# Patient Record
Sex: Male | Born: 1970 | Race: Black or African American | Hispanic: No | Marital: Single
Health system: Southern US, Community
[De-identification: ages and names within clinical notes are randomized; demographics above are authoritative.]

---

## 2006-05-17 ENCOUNTER — Other Ambulatory Visit: Payer: Self-pay

## 2006-05-17 ENCOUNTER — Emergency Department: Payer: Self-pay | Admitting: Unknown Physician Specialty

## 2006-12-06 ENCOUNTER — Emergency Department: Payer: Self-pay | Admitting: Unknown Physician Specialty

## 2008-08-23 ENCOUNTER — Emergency Department: Payer: Self-pay | Admitting: Emergency Medicine

## 2008-11-17 ENCOUNTER — Emergency Department: Payer: Self-pay | Admitting: Emergency Medicine

## 2010-01-18 ENCOUNTER — Emergency Department: Payer: Self-pay | Admitting: Emergency Medicine

## 2013-03-29 ENCOUNTER — Emergency Department: Payer: Self-pay | Admitting: Emergency Medicine

## 2013-03-29 LAB — URINALYSIS, COMPLETE
Bilirubin,UR: NEGATIVE
Glucose,UR: NEGATIVE mg/dL (ref 0–75)
Ketone: NEGATIVE
Nitrite: NEGATIVE
RBC,UR: 242 /HPF (ref 0–5)
Specific Gravity: 1.023 (ref 1.003–1.030)
Squamous Epithelial: NONE SEEN
WBC UR: 691 /HPF (ref 0–5)

## 2013-03-29 LAB — RAPID INFLUENZA A&B ANTIGENS

## 2013-04-05 ENCOUNTER — Inpatient Hospital Stay: Payer: Self-pay | Admitting: Internal Medicine

## 2013-04-05 LAB — BASIC METABOLIC PANEL
BUN: 11 mg/dL (ref 7–18)
Calcium, Total: 9.1 mg/dL (ref 8.5–10.1)
Chloride: 105 mmol/L (ref 98–107)
Creatinine: 1.03 mg/dL (ref 0.60–1.30)
EGFR (Non-African Amer.): >60
Osmolality: 272 (ref 275–301)
Potassium: 4 mmol/L (ref 3.5–5.1)
Sodium: 137 mmol/L (ref 136–145)

## 2013-04-05 LAB — URINALYSIS, COMPLETE
Blood: NEGATIVE
Glucose,UR: NEGATIVE mg/dL (ref 0–75)
Hyaline Cast: 14
Ph: 5 (ref 4.5–8.0)
RBC,UR: 2 /HPF (ref 0–5)
Specific Gravity: 1.019 (ref 1.003–1.030)
Squamous Epithelial: 2

## 2013-04-05 LAB — CBC
HCT: 21.9 % — ABNORMAL LOW (ref 40.0–52.0)
HGB: 5.9 g/dL — ABNORMAL LOW (ref 13.0–18.0)
MCH: 16.9 pg — ABNORMAL LOW (ref 26.0–34.0)
MCHC: 26.8 g/dL — ABNORMAL LOW (ref 32.0–36.0)
MCV: 63 fL — ABNORMAL LOW (ref 80–100)
RBC: 3.47 10*6/uL — ABNORMAL LOW (ref 4.40–5.90)
RDW: 24.4 % — ABNORMAL HIGH (ref 11.5–14.5)
WBC: 18.7 10*3/uL — ABNORMAL HIGH (ref 3.8–10.6)

## 2013-04-05 LAB — HEPATIC FUNCTION PANEL A (ARMC)
Albumin: 3.3 g/dL — ABNORMAL LOW (ref 3.4–5.0)
SGOT(AST): 20 U/L (ref 15–37)
SGPT (ALT): 17 U/L (ref 12–78)

## 2013-04-05 LAB — RETICULOCYTES
Absolute Retic Count: 0.1069 10*6/uL (ref 0.019–0.186)
Reticulocyte: 3.29 % — ABNORMAL HIGH (ref 0.4–3.1)

## 2013-04-05 LAB — IRON AND TIBC
Iron Saturation: 2 %
Iron: 10 ug/dL — ABNORMAL LOW (ref 65–175)

## 2013-04-05 LAB — DIFFERENTIAL
Eosinophil #: 0 10*3/uL (ref 0.0–0.7)
Eosinophil %: 0.2 %
Lymphocyte #: 2.2 10*3/uL (ref 1.0–3.6)
Monocyte %: 5.4 %
Neutrophil #: 15.2 10*3/uL — ABNORMAL HIGH (ref 1.4–6.5)
Neutrophil %: 81.6 %

## 2013-04-05 LAB — RAPID HIV-1/2 QL/CONFIRM: HIV-1/2,Rapid Ql: NEGATIVE

## 2013-04-05 LAB — LACTATE DEHYDROGENASE: LDH: 162 U/L (ref 85–241)

## 2013-04-05 LAB — RAPID INFLUENZA A&B ANTIGENS

## 2013-04-06 LAB — COMPREHENSIVE METABOLIC PANEL
Albumin: 3 g/dL — ABNORMAL LOW (ref 3.4–5.0)
Alkaline Phosphatase: 70 U/L
Anion Gap: 5 — ABNORMAL LOW (ref 7–16)
BUN: 10 mg/dL (ref 7–18)
Calcium, Total: 8.6 mg/dL (ref 8.5–10.1)
Chloride: 109 mmol/L — ABNORMAL HIGH (ref 98–107)
Co2: 26 mmol/L (ref 21–32)
EGFR (African American): >60
EGFR (Non-African Amer.): >60
Osmolality: 278 (ref 275–301)
Total Protein: 7.5 g/dL (ref 6.4–8.2)

## 2013-04-06 LAB — CBC WITH DIFFERENTIAL/PLATELET
Basophil #: 0 10*3/uL (ref 0.0–0.1)
Basophil %: 0.3 %
Eosinophil %: 0.4 %
Lymphocyte #: 2.2 10*3/uL (ref 1.0–3.6)
Lymphocyte %: 17.7 %
MCH: 20.2 pg — ABNORMAL LOW (ref 26.0–34.0)
MCHC: 29.6 g/dL — ABNORMAL LOW (ref 32.0–36.0)
MCV: 68 fL — ABNORMAL LOW (ref 80–100)
Monocyte #: 1.1 x10 3/mm — ABNORMAL HIGH (ref 0.2–1.0)
Neutrophil #: 9.1 10*3/uL — ABNORMAL HIGH (ref 1.4–6.5)
Neutrophil %: 72.7 %
Platelet: 342 10*3/uL (ref 150–440)
RBC: 3.73 10*6/uL — ABNORMAL LOW (ref 4.40–5.90)

## 2013-04-06 LAB — FERRITIN: Ferritin (ARMC): 5 ng/mL — ABNORMAL LOW (ref 8–388)

## 2013-04-06 LAB — MAGNESIUM: Magnesium: 2.1 mg/dL

## 2013-04-06 LAB — TSH: Thyroid Stimulating Horm: 1.83 u[IU]/mL

## 2013-04-07 ENCOUNTER — Emergency Department: Payer: Self-pay | Admitting: Emergency Medicine

## 2013-04-07 LAB — CBC WITH DIFFERENTIAL/PLATELET
Basophil #: 0.1 10*3/uL (ref 0.0–0.1)
Basophil %: 0.6 %
Eosinophil #: 0.1 10*3/uL (ref 0.0–0.7)
Eosinophil #: 0.1 10*3/uL (ref 0.0–0.7)
Eosinophil %: 1 %
Eosinophil %: 1 %
HCT: 27.1 % — ABNORMAL LOW (ref 40.0–52.0)
HGB: 8 g/dL — ABNORMAL LOW (ref 13.0–18.0)
Lymphocyte #: 2.1 10*3/uL (ref 1.0–3.6)
Lymphocyte #: 2.4 10*3/uL (ref 1.0–3.6)
Lymphocyte %: 16.7 %
MCH: 20.4 pg — ABNORMAL LOW (ref 26.0–34.0)
MCHC: 30.2 g/dL — ABNORMAL LOW (ref 32.0–36.0)
MCHC: 29.7 g/dL — ABNORMAL LOW (ref 32.0–36.0)
Monocyte #: 1.1 x10 3/mm — ABNORMAL HIGH (ref 0.2–1.0)
Monocyte #: 1.3 x10 3/mm — ABNORMAL HIGH (ref 0.2–1.0)
Monocyte %: 8.3 %
Monocyte %: 9.4 %
Neutrophil #: 9.3 10*3/uL — ABNORMAL HIGH (ref 1.4–6.5)
Neutrophil #: 9.4 10*3/uL — ABNORMAL HIGH (ref 1.4–6.5)
Neutrophil %: 73.4 %
Neutrophil %: 70.7 %
Platelet: 303 10*3/uL (ref 150–440)
Platelet: 299 10*3/uL (ref 150–440)
RDW: 28.8 % — ABNORMAL HIGH (ref 11.5–14.5)
WBC: 12.7 10*3/uL — ABNORMAL HIGH (ref 3.8–10.6)

## 2013-04-07 LAB — COMPREHENSIVE METABOLIC PANEL
Albumin: 2.9 g/dL — ABNORMAL LOW (ref 3.4–5.0)
Alkaline Phosphatase: 69 U/L
Anion Gap: 4 — ABNORMAL LOW (ref 7–16)
Bilirubin,Total: 0.3 mg/dL (ref 0.2–1.0)
Calcium, Total: 9.3 mg/dL (ref 8.5–10.1)
Chloride: 107 mmol/L (ref 98–107)
Co2: 26 mmol/L (ref 21–32)
Creatinine: 0.76 mg/dL (ref 0.60–1.30)
EGFR (African American): >60
Glucose: 94 mg/dL (ref 65–99)
Osmolality: 271 (ref 275–301)
Potassium: 3.9 mmol/L (ref 3.5–5.1)
SGOT(AST): 24 U/L (ref 15–37)
SGPT (ALT): 16 U/L (ref 12–78)
Sodium: 137 mmol/L (ref 136–145)
Total Protein: 7.7 g/dL (ref 6.4–8.2)

## 2014-02-07 ENCOUNTER — Emergency Department: Payer: Self-pay | Admitting: Emergency Medicine

## 2014-02-07 LAB — COMPREHENSIVE METABOLIC PANEL
ALT: 21 U/L
ANION GAP: 2 — AB (ref 7–16)
Albumin: 4.1 g/dL (ref 3.4–5.0)
Alkaline Phosphatase: 88 U/L
BUN: 10 mg/dL (ref 7–18)
Bilirubin,Total: 0.3 mg/dL (ref 0.2–1.0)
CHLORIDE: 108 mmol/L — AB (ref 98–107)
CREATININE: 1.07 mg/dL (ref 0.60–1.30)
Calcium, Total: 9.3 mg/dL (ref 8.5–10.1)
Co2: 30 mmol/L (ref 21–32)
Glucose: 92 mg/dL (ref 65–99)
OSMOLALITY: 278 (ref 275–301)
Potassium: 4.1 mmol/L (ref 3.5–5.1)
SGOT(AST): 19 U/L (ref 15–37)
Sodium: 140 mmol/L (ref 136–145)
Total Protein: 8.1 g/dL (ref 6.4–8.2)

## 2014-02-07 LAB — CBC WITH DIFFERENTIAL/PLATELET
BASOS ABS: 0 10*3/uL (ref 0.0–0.1)
BASOS PCT: 0.8 %
Eosinophil #: 0.2 10*3/uL (ref 0.0–0.7)
Eosinophil %: 4.6 %
HCT: 48.4 % (ref 40.0–52.0)
HGB: 15.4 g/dL (ref 13.0–18.0)
Lymphocyte #: 1.8 10*3/uL (ref 1.0–3.6)
Lymphocyte %: 42.4 %
MCH: 29.1 pg (ref 26.0–34.0)
MCHC: 31.7 g/dL — ABNORMAL LOW (ref 32.0–36.0)
MCV: 92 fL (ref 80–100)
MONO ABS: 0.5 x10 3/mm (ref 0.2–1.0)
Monocyte %: 10.9 %
Neutrophil #: 1.8 10*3/uL (ref 1.4–6.5)
Neutrophil %: 41.3 %
Platelet: 134 10*3/uL — ABNORMAL LOW (ref 150–440)
RBC: 5.28 10*6/uL (ref 4.40–5.90)
RDW: 14.9 % — AB (ref 11.5–14.5)
WBC: 4.3 10*3/uL (ref 3.8–10.6)

## 2014-08-04 NOTE — Consult Note (Signed)
Brief Consult Note: Diagnosis: Anemia.  Year long history of rectal bleeding. Syncopal episode.   Consult note dictated.   Comments: Patient's presentation discussed with Dr. Lutricia FeilPaul Oh.  Recommendation is for continued serial monitoring of hemoglobin.  Transfuse as necessary.  Will place patient on clear liquid diet for tomorrow. Dr. Shelle Ironein will be covering tomorrow.  Will allow Dr. Shelle Ironein to make the decision about proceeding wtih diagnostic colonoscopy on Friday of this week for the indications of IDA and rectal bleeding.  Family history of colon cancer involving second degree relative.  Procedure, risks versus benefits discussed with patient.  Patient voiced understanding and willing to proceed with luminal evaluation inpatient or outpatient.  Electronic Signatures: Rodman KeyHarrison, Lavida Patch S (NP)  (Signed 24-Dec-14 10:37)  Authored: Brief Consult Note   Last Updated: 24-Dec-14 10:37 by Rodman KeyHarrison, Kelven Flater S (NP)

## 2014-08-04 NOTE — Consult Note (Signed)
Pt seen and examined. Please see Dawn Harrison's notes. Pt with chronic hx of hematochezia. Family hx of colon cancer/diverticulosis. Pt had reg diet today. Feeling much better. Clear liquid diet ordered for tomorow. Dr. Shelle Ironein will determine whether to proceed with inpt colonoscopy on Fri and do as outpt if patient wants to be home for Christmas. Thanks.  Electronic Signatures: Lutricia Feilh, Amaryllis Malmquist (MD)  (Signed on 24-Dec-14 11:04)  Authored  Last Updated: 24-Dec-14 11:04 by Lutricia Feilh, Kammy Klett (MD)

## 2014-08-04 NOTE — Discharge Summary (Signed)
PATIENT NAME:  Jared Arnold, Jared Arnold MR#:  [id number] DATE OF BIRTH:  Aug 02, 1970  DATE OF ADMISSION:  04/05/2013 DATE OF DISCHARGE:  04/07/2013  DISCHARGE DIAGNOSES: 1.  Symptomatic iron deficiency anemia, now hemodynamically stable, status post 2 units of packed red blood cell transfusion on iron replacement. Will require outpatient gastroenterology work-up.  2.  Orthostatic hypotension now resolved, likely due to anemia.  3.  Urinary tract infection treated, urine culture being negative. Will stop antibiotics.   SECONDARY DIAGNOSIS: None.   CONSULTATIONS: GI, Dr. Lutricia FeilPaul Oh.   PROCEDURES AND RADIOLOGY: 1.  Chest x-ray on the 23rd of December showed no acute cardiopulmonary disease.  2.  UA on admission was showing trace bacteria, 10 WBCs, 2+ leukocyte esterase.  3.  Urine culture was negative. Blood cultures x 2 were negative on admission.  4.  Influenza A and B were negative. 5.  Serum haptoglobin was elevated with a value of 209.  6.  Rapid HIV antibodies were negative.   HISTORY AND SHORT HOSPITAL COURSE: The patient is a 44 year old male with no significant medical problem was admitted for syncopal episode thought to be secondary to orthostatic hypotension from severe symptomatic anemia, likely due to iron deficiency. Please see Dr. Larose HiresVachhani's dictated history and physical for further details. The patient was started on iron therapy as his iron was significantly low with a value of 10. He was feeling much better. He was given 2 units of packed red blood cells for transfusion as his hemoglobin on admission was 5.9. The patient's hemoglobin responded well to 2 units of blood and has been gradually improving. His hemoglobin is 8 today and GI consult has been obtained with Dr. Bluford Kaufmannh who recommended outpatient GI evaluation, as the patient remained hemodynamically stable. After discussion with the patient, he was agreeable with the same and is being discharged home in stable condition.     On the  date of discharge, his vital signs are as follows: Temperature 98.4, heart rate 78 per minute, respirations 18 per minute, blood pressure 116/68 mmHg. He was saturating 99% on room air.   PERTINENT PHYSICAL EXAMINATION:  ON the date of discharge:  CARDIOVASCULAR: S1, S2 normal. No murmurs, rubs or gallop.  LUNGS: Clear to auscultation bilaterally. No wheezing, rales, rhonchi or crepitation.  ABDOMEN: Soft, benign.  NEUROLOGIC: Nonfocal examination. All other physical examination remained at baseline.   DISCHARGE MEDICATIONS:  Iron sulfate 325 mg p.o. 3 times a day.   DISCHARGE DIET: Regular.   DISCHARGE ACTIVITY: As tolerated.  DISCHARGE INSTRUCTIONS AND FOLLOWUP:  The patient was instructed to follow up with a new primary care physician at [facility] in 1 to 2 weeks. [facility] to 4 weeks.  TOTAL TIME DISCHARGING THIS PATIENT:  55 minutes.   ____________________________ Ellamae SiaVipul S. Sherryll BurgerShah, MD vss:ce D: 04/07/2013 14:22:00 ET Arnold: 04/07/2013 19:06:55 ET JOB#: [id number]  cc: Tarance Balan S. Sherryll BurgerShah, MD, <Dictator> [facility] Ezzard StandingPaul Y. Bluford Kaufmannh, MD Patricia PesaVIPUL S Simona Rocque MD ELECTRONICALLY SIGNED 04/10/2013 10:27

## 2014-08-05 NOTE — Consult Note (Signed)
PATIENT NAME:  Jared Arnold, Jared Arnold DATE OF BIRTH:  [birth year]  DATE OF ADMISSION:  04/05/2013 DATE OF CONSULTATION:  04/06/2013  CONSULTING PHYSICIAN:  Jared Keyawn S. Harsimran Westman, NP and Jared Arnold.   Jared Arnold is a 44 year old African American gentleman who has unremarkable past medical history.  He does state that about 3 to 4 weeks ago he was diagnosed with the flu, and then last week he presented to the Emergency Room for the complaint of hematuria and dysuria. He was diagnosed with a urinary tract infection and was placed on ciprofloxacin. States that he is supposed to finish the antibiotic regimen today. The patient does not complain of hematuria or dysuria at this point. 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 He states he has not been doing very much work, thus does not really complain of excessive fatigue or weakness.  Yesterday, he was getting ready to go out with his cousin and sustained a syncopal episode. He lost consciousness for about 1 to 2 seconds. His cousin helped him up and then brought him to the Emergency Room where he was found to have a hemoglobin of 5.9 and admitted for a syncopal episode and severe anemia. He is also found to have orthostatic hypotension.   He denies any nausea or vomiting. No abdominal pain. Weight has been stable. Bowels have been on average every other day for the past year, although he has noted evidence of rectal bleeding, frank bright red blood noted mixed in stool, water, as well as on toilet paper. States that he has had 1 occurrence on average of rectal bleeding. No melena. The patient denies ever having any colonoscopy or an upper endoscopy performed in the past. No history of chest pain, shortness of breath or dizziness. The patient states that he had been feeling well.   PAST MEDICAL HISTORY: Unremarkable.   PAST SURGICAL HISTORY: Unremarkable.   SOCIAL HISTORY: Resides with his mother.  Smokes a pack of cigarettes a day. No alcohol for the past month. Prior use was a sixpack a week. Marijuana use on a regular basis. The last occurrence was yesterday. Denies any other history of recreational drug use. Currently unemployed.   FAMILY HISTORY: Mother with history of diabetes, lung cancer involving grandparents, maternal uncle history of colon cancer, diagnosed in his 850s. No family history of colonic polyps, IBD or celiac disease.   MEDICATIONS:  Ciprofloxacin as prescribed for urinary tract infection.   ALLERGIES: None.   REVIEW OF SYSTEMS: CONSTITUTIONAL: No fevers. Mild fatigue, weakness, but again, denies excessive.  EYES: No blurred vision, double vision.  EARS, NOSE AND THROAT: No tinnitus, ear pain, hearing loss.  RESPIRATORY: No coughing. No wheezing.  CARDIOVASCULAR: No chest pain, heart palpitations, syncopal episodes.  GASTROINTESTINAL: See HPI.  GENITOURINARY: See HPI.  ENDOCRINE: No heat or cold intolerance.  SKIN: No rashes. No lesions.  MUSCULOSKELETAL: Denies arthralgias or myalgias.  NEUROLOGIC: Denies history of CVA, seizure disorder or TIA.  PSYCHIATRIC: No history of depression or anxiety.   PHYSICAL EXAMINATION: VITAL SIGNS: Temperature 98.4. Respirations are 18. Blood pressure is 105/66 with a pulse of 75. O2 sat is 99%.  GENERAL: Well-developed, well-nourished 44 year old African American gentleman, no acute distress noted. Pleasant. Resting comfortably in bed.  HEENT: Normocephalic, atraumatic. Pupils equal and reactive to light. Conjunctivae clear. Sclerae anicteric.  NECK: Supple. Trachea midline. No lymphadenopathy or thyromegaly.  PULMONARY: Symmetric rise and fall of chest. Clear to  auscultation throughout.  CARDIOVASCULAR: Regular rhythm, S1, S2. No murmurs. No gallops.  ABDOMEN: Soft, nondistended. Bowel sounds in 4 quadrants. No bruits. No masses. No evidence of hepatosplenomegaly.  RECTAL:  Numerous external hemorrhoids noted,  non-thrombosed. No evidence of active bleeding. Digital examination: Tenderness in excess probably related to hemorrhoids. Yellow-brownish-colored stool. Heme negative.  MUSCULOSKELETAL: Moves all 4 extremities. No contractures. No clubbing.  EXTREMITIES: No edema.  PSYCHIATRIC: Alert and oriented x 4. Memory grossly intact. Appropriate affect and mood.   LABORATORY, DIAGNOSTIC AND RADIOLOGICAL DATA: Chemistry panel on admission within normal except serum Arnold was low at 10.  BUN was 11. Anion gap is 6. TIBC is elevated at 490. Comparison to today's date, chemistry panel remains within normal limits except chloride is elevated at 109. Anion gap is still low at 5. Ferritin level is 5. Magnesium 2.1, calcium 8.6. Hepatic panel: Albumin was low at 3.3 on admission and currently is 3.0 today, but AST, ALT all remain within normal limits. Troponin is less than 0.02. CBC: WBC count elevated at 18.7, RBC of 3.47, hemoglobin 5.9, hematocrit 21.9, platelet count 473,000, MCV low at 63 with an MCH of 16.9 and MCHC of 26.8, RDW 24.1. WBC count still remains elevated, but improved at 12.5. Hemoglobin after 2 units of packed red blood cells being transfused, transfused hemoglobin is 7.5 with hematocrit of 25.3. Retic count is elevated at 3.29. Absolute retic count is 0.1069. Blood cultures x 2: No growth 8 to 12 hours. Influenza swab is negative.  Urinalysis: +2 leukocytes. Blood is negative. Haptoglobin is elevated at 209. HIV is negative. Chest, PA and lateral, no acute cardiopulmonary disease.   IMPRESSION: Anemia, year-long history of rectal bleeding intermittently, syncopal episode.   PLAN: The patient's presentation was discussed with Dr. Lutricia Feil.  Recommendations are to continue serial monitoring of hemoglobin at this time, transfuse as necessary. We will place the patient on clear liquid diet tomorrow. Jared Arnold is covering tomorrow. We will allow Jared Arnold to make the decision about proceed with diagnostic  colonoscopy on Friday of this week for the indication of IDA and rectal bleeding. Family history of colon cancer involved in second-degree relative. Procedure risks versus benefits was discussed with the patient. The patient voiced understanding and willing to proceed with luminal evaluation, inpatient or outpatient, depending on decision which is made.   These services provided by Jared Key, MS, APRN, [facility], FNP, under collaborative agreement with Dr. Lutricia Feil.     ____________________________ Jared Key, NP dsh:dmm D: 04/06/2013 10:47:00 ET T: 04/06/2013 10:58:05 ET JOB#: [id number]  cc: Jared Key, NP, <Dictator> Jared Key MD ELECTRONICALLY SIGNED 04/18/2013 8:11

## 2014-08-05 NOTE — H&P (Signed)
PATIENT NAME:  FADEL, CLASON MR#:  [id number] DATE OF BIRTH:  1970-05-02  DATE OF ADMISSION:  04/05/2013  PRIMARY CARE PHYSICIAN: None.   REFERRING EMERGENCY ROOM PHYSICIAN:  Lurena Joiner L. Lord, MD  CHIEF COMPLAINT: Syncopal episode.   HISTORY OF PRESENT ILLNESS: A 44 year old male with not known past medical history, not going to any doctor. Last week, he had complaint of noticing some blood in his urine and had burning in the urine so came to the Emergency Room, found having positive UA and was given ciprofloxacin orally to be finished at home for 7 days.  Today is his last day to take for that and he states that his symptoms are improving. He does not notice any more blood in the urine now and his urine does not bun anymore.  Overall, he has complaint of feeling a little weak but he is not  very active anyway. 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 Today, he was with his cousin, tried to get up to go out with him and suddenly while getting up he passed out and fell on the floor, lost his consciousness for almost 1 to 2 seconds and then cousin got him up, helped him to get up and sit on the bed. He regained his consciousness immediately and totally after the episode, did not have any abnormal movements or loss of control over bowel or bladder, did not have any headache, palpitations or vertigo before or after the episode and so finally, he was brought to the Emergency Room by them. He says that he had a similar episode a few months ago but he did not seek any medical intervention for that. In ER, on further workup he was found having severe anemia, hemoglobin 5.9, and he was found orthostatic hypotension so being admitted to medical service for further management.   PAST MEDICAL HISTORY: None.   PAST SURGICAL HISTORY: None.   SOCIAL HISTORY: He lives with his mother, smokes almost a pack every day. Denies any alcohol  drinking since the last 1 month. Before that, he was regular drinker of beer. He smokes marijuana, last one was today. Denies any injection drug use.   FAMILY HISTORY: Positive for diabetes in mother and lung cancer in grandparents.  One of his maternal uncles died with colon cancer.   HOME MEDICATIONS:  Ciprofloxacin orally since the last 1 week for UTI.  REVIEW OF SYSTEMS: .CONSTITUTIONAL: Negative for fever, positive for fatigue and weakness. No pain or weight loss.  EYES: No blurring, double vision, discharge or redness.  EARS, NOSE, THROAT: No tinnitus, ear pain or hearing loss.  RESPIRATORY: No cough, wheezing, hemoptysis or shortness of breath.  CARDIOVASCULAR: No chest pain, orthopnea, edema, arrhythmia or palpitations but had a syncopal episode.  GASTROINTESTINAL: No nausea, vomiting, diarrhea, abdominal pain.  GENITOURINARY: No dysuria, hematuria. Has some increased frequency and burning in the urine last week but currently denies.  ENDOCRINE: No increased sweating. No heat or cold intolerance.  SKIN: No acne or rashes.  MUSCULOSKELETAL: No pain or swelling in the joints.  NEUROLOGICAL: No numbness, weakness, tremor or vertigo. PSYCHIATRIC: No anxiety, insomnia, bipolar disorder.   PHYSICAL EXAMINATION: VITAL SIGNS: In the ER, temperature 97.7, pulse 82, respirations 16, blood pressure 125/70 which dropped to 87/54 and pulse rate came up to 96 on standing up.  GENERAL: The patient is fully alert and oriented to time, place and  person. Not in any acute distress, cooperative with history-taking and physical examination.  HEENT: Head and neck atraumatic, conjunctivae pale, oral mucosa moist.  NECK: Supple. No JVD.  RESPIRATORY: Bilateral clear and equal entry.  CARDIOVASCULAR: S1, S2 present, regular. No murmur.  ABDOMEN: Soft, nontender. Bowel sounds present. No organomegaly.  SKIN: No rashes.  LEGS: No edema.  NEUROLOGICAL: Power 4 out of 5, generalized weakness. Moves all 4  limbs.  JOINTS: No swelling or tenderness.   IMPORTANT LABORATORY RESULTS: Glucose 74, BUN 11, creatinine 1.03, sodium 137, potassium 4, chloride 105, CO2 26, calcium 9.1. Troponin less than 0.02. Marijuana in the urine is positive.  CBC: WBC 18.7, hemoglobin 5.9, hematocrit 21.9, platelet count 473 and MCV is 63. Urinalysis is yellow color, cloudy and had 2+ leukocyte esterase, 242 RBCs and 691 WBCs last week which is now 2 RBCs and 10 WBCs. Chest x-ray is negative for any acute finding.   ASSESSMENT AND PLAN: A 44 year old male without any significant past medical history, noncompliant and not following with any physician, had urinary tract infection last week and received full-dose antibiotic therapy for 7 days so far. Came to Emergency Room after having a syncopal episode witnessed by cousin for a few seconds without any abnormal movement, found having severe anemia.  1.  Symptomatic anemia: Iron deficiency. As his MCV is low, will do iron studies. Will check stool for any presence of blood and blood transfusion is ordered by ER physician and.  2.  Orthostatic hypotension. I am not sure if this systemic inflammatory response syndrome or not as the patient had evaluated white cell count and drop in the blood pressure but he does not have elevated heart rate or no fever. He recently had urine infection which looks like to be successfully treated as the patient has resolution of his symptoms with ciprofloxacin but we will get urine and blood culture and ER physician already ordered Rocephin IV. I will continue that till we have any further idea about this.  3.  Urinary tract infection as mentioned above did improve. Continue Rocephin and get urine culture.  4.  Smoker. Smoking cessation counseling done for 4 minutes and the patient denied trying nicotine patch or inhalers at this point.  5.  CODE STATUS: Full code.   TOTAL TIME SPENT: 50 minutes on this admission.     ____________________________ Hope PigeonVaibhavkumar G. Elisabeth PigeonVachhani, MD vgv:cs D: 04/05/2013 18:56:51 ET T: 04/05/2013 19:23:10 ET JOB#: [id number]  cc: Hope PigeonVaibhavkumar G. Elisabeth PigeonVachhani, MD, <Dictator> Altamese DillingVAIBHAVKUMAR Langston [place] MD ELECTRONICALLY SIGNED 04/17/2013 21:56

## 2014-10-24 IMAGING — CR DG CHEST 2V
1 series · 2 of 2 positions shown · non-contrast
Comparison: March 29, 2013.

CLINICAL DATA: Syncope.

EXAM:
CHEST  2 VIEW

[Series 1: pa · 0.17mm/px · 2 of 2 slices shown]
[im 1/2]
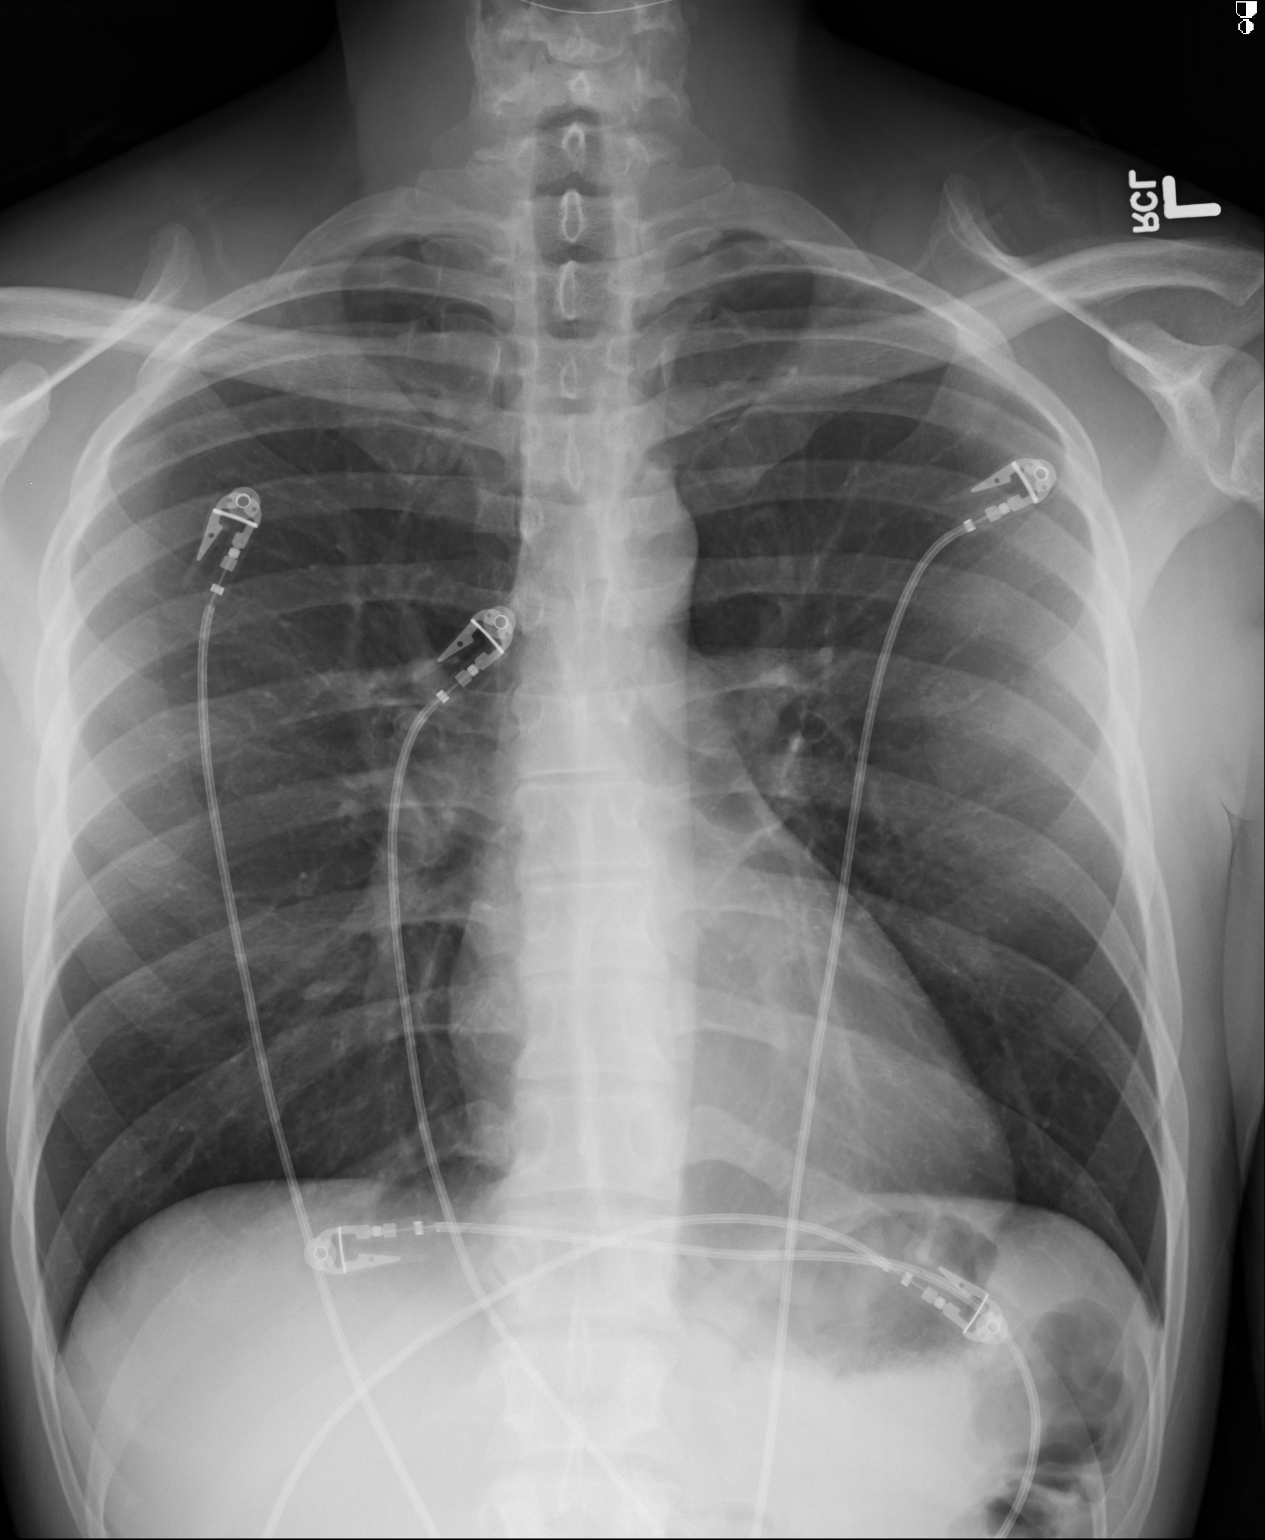
[im 2/2]
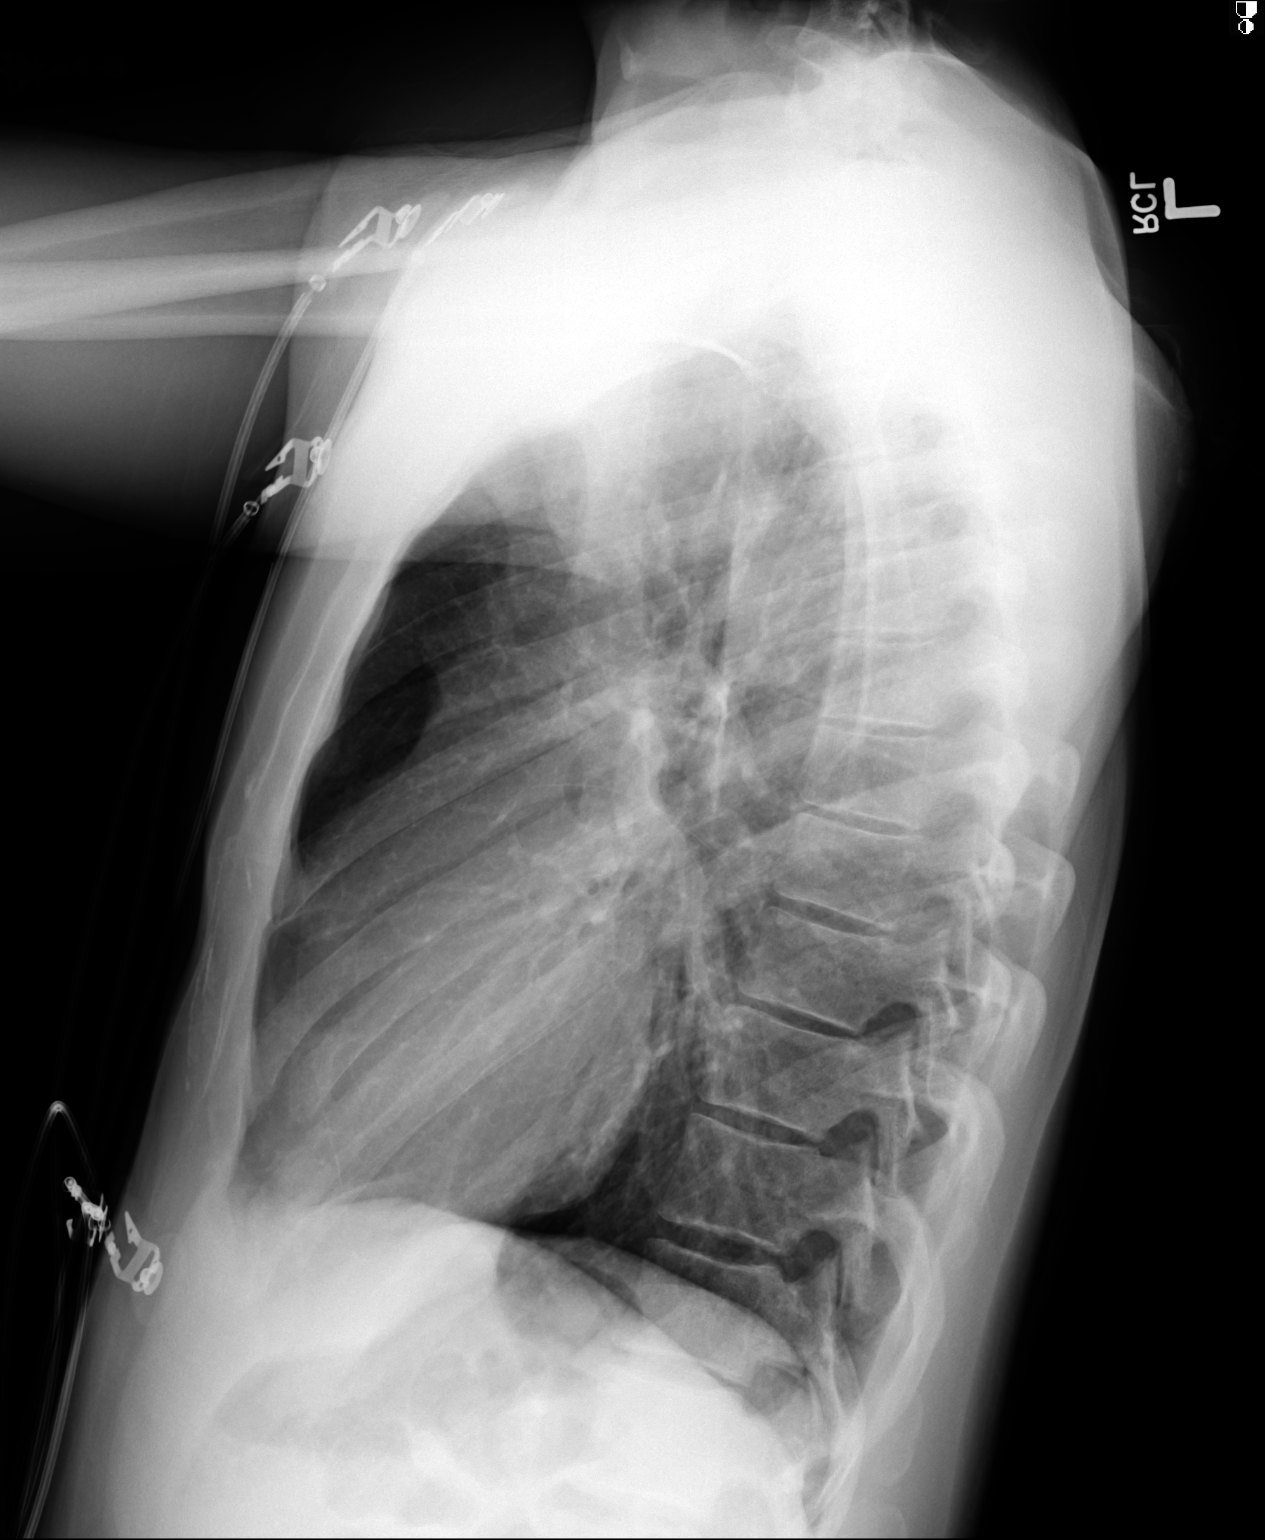

[2 of 2 positions shown; findings below may reference images not displayed]

FINDINGS: The heart size and mediastinal contours are within normal limits.
Both lungs are clear. No pleural effusion or pneumothorax is noted.
The visualized skeletal structures are unremarkable.
IMPRESSION: No active cardiopulmonary disease.

## 2019-09-14 ENCOUNTER — Other Ambulatory Visit: Payer: Self-pay

## 2019-09-14 ENCOUNTER — Encounter: Payer: Self-pay | Admitting: Emergency Medicine

## 2019-09-14 ENCOUNTER — Emergency Department
Admission: EM | Admit: 2019-09-14 | Discharge: 2019-09-14 | Disposition: A | Discharge status: Home / Self Care | Payer: No Typology Code available for payment source | Attending: Emergency Medicine | Admitting: Emergency Medicine

## 2019-09-14 ENCOUNTER — Emergency Department: Payer: No Typology Code available for payment source

## 2019-09-14 DIAGNOSIS — S161XXA Strain of muscle, fascia and tendon at neck level, initial encounter: Secondary | ICD-10-CM | POA: Diagnosis not present

## 2019-09-14 DIAGNOSIS — Y92410 Unspecified street and highway as the place of occurrence of the external cause: Secondary | ICD-10-CM | POA: Insufficient documentation

## 2019-09-14 DIAGNOSIS — Y999 Unspecified external cause status: Secondary | ICD-10-CM | POA: Insufficient documentation

## 2019-09-14 DIAGNOSIS — S199XXA Unspecified injury of neck, initial encounter: Secondary | ICD-10-CM | POA: Diagnosis present

## 2019-09-14 DIAGNOSIS — Y9389 Activity, other specified: Secondary | ICD-10-CM | POA: Insufficient documentation

## 2019-09-14 MED ORDER — NAPROXEN 500 MG PO TABS: 500.0000 mg | ORAL_TABLET | Freq: Two times a day (BID) | ORAL | 0 refills | Status: DC

## 2019-09-14 MED ORDER — TRAMADOL HCL 50 MG PO TABS: 50.0000 mg | ORAL_TABLET | Freq: Four times a day (QID) | ORAL | 0 refills | Status: AC | PRN
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
## 2019-09-14 NOTE — Discharge Instructions (Signed)
Follow-up with your primary care provider if any continued problems or return to the emergency department if any worsening of your symptoms.  Take medication only as directed.  The naproxen is twice a day with food for inflammation and pain.  The tramadol is to be taken at bedtime if needed for moderate to severe pain.  You may also take it every 6 hours if needed for moderate pain however do not drive while taking this medication.  Use ice or heat to your neck as needed for discomfort.

## 2021-06-19 ENCOUNTER — Emergency Department: Payer: PRIVATE HEALTH INSURANCE

## 2021-06-19 ENCOUNTER — Emergency Department
Admission: EM | Admit: 2021-06-19 | Discharge: 2021-06-20 | Disposition: A | Discharge status: Home / Self Care | Payer: PRIVATE HEALTH INSURANCE | Attending: Emergency Medicine | Admitting: Emergency Medicine

## 2021-06-19 ENCOUNTER — Other Ambulatory Visit: Payer: Self-pay

## 2021-06-19 DIAGNOSIS — M546 Pain in thoracic spine: Secondary | ICD-10-CM | POA: Insufficient documentation

## 2021-06-19 MED ORDER — KETOROLAC TROMETHAMINE 60 MG/2ML IM SOLN
30.0000 mg | Freq: Once | INTRAMUSCULAR | Status: AC
  Administered 2021-06-20: 01:00:00 30 mg via INTRAMUSCULAR
  Filled 2021-06-19: qty 2

## 2021-06-19 NOTE — ED Provider Notes (Incomplete)
[facility] Provider Note    Event Date/Time   First MD Initiated Contact with Patient 06/19/21 2312     (approximate)   History   Back Pain   HPI {Remember to add pertinent medical, surgical, social, and/or OB history to HPI:1} Jared Arnold is a 51 y.o. male  ***       Past Medical History  History reviewed. No pertinent past medical history.   Active Problem List  There are no problems to display for this patient.    Past Surgical History  History reviewed. No pertinent surgical history.   Home Medications   Prior to Admission medications   Medication Sig Start Date End Date Taking? Authorizing Provider  naproxen (NAPROSYN) 500 MG tablet Take 1 tablet (500 mg total) by mouth 2 (two) times daily with a meal. 09/14/19   Johnn Hai, PA-C  traMADol (ULTRAM) 50 MG tablet Take 1 tablet (50 mg total) by mouth every 6 (six) hours as needed. 09/14/19   Johnn Hai, PA-C     Allergies  Patient has no known allergies.   Family History  History reviewed. No pertinent family history.   Physical Exam  Triage Vital Signs: ED Triage Vitals  Enc Vitals Group     BP 06/19/21 2308 (!) 162/92     Pulse Rate 06/19/21 2308 62     Resp 06/19/21 2308 16     Temp 06/19/21 2308 98.9 F (37.2 C)     Temp Source 06/19/21 2308 Oral     SpO2 06/19/21 2308 99 %     Weight 06/19/21 2309 175 lb (79.4 kg)     Height 06/19/21 2309 6\' 1"  (1.854 m)     Head Circumference --      Peak Flow --      Pain Score 06/19/21 2309 4     Pain Loc --      Pain Edu? --      Excl. in [place]? --     Updated Vital Signs: BP (!) 162/92    Pulse 62    Temp 98.9 F (37.2 C) (Oral)    Resp 16    Ht 6\' 1"  (1.854 m)    Wt 79.4 kg    SpO2 99%    BMI 23.09 kg/m   {Only need to document appropriate and relevant physical exam:1} General: Awake, no distress. *** CV:  Good peripheral perfusion. *** Resp:  Normal effort. *** Abd:  No distention.  *** Other:  ***   ED Results / Procedures / Treatments  Labs (all labs ordered are listed, but only abnormal results are displayed) Labs Reviewed - No data to display   EKG  ***   RADIOLOGY *** {You MUST document your own interpretation of imaging, as well as the fact that you reviewed the radiologist's report!:1}  Official radiology report(s): No results found.   PROCEDURES:  Critical Care performed: {CriticalCareYesNo:19197::"Yes, see critical care procedure note(s)","No"}  Procedures   MEDICATIONS ORDERED IN ED: Medications - No data to display   IMPRESSION / MDM / [employer] / ED COURSE  I reviewed the triage vital signs and the nursing notes.                              Differential diagnosis includes, but is not limited to, ***  {If the patient is on the monitor, remove the brackets and asterisks on the sentence  below and remember to document it as a Procedure as well. Otherwise delete the sentence below:1} {**The patient is on the cardiac monitor to evaluate for evidence of arrhythmia and/or significant heart rate changes.**}  {Remember to include, when applicable, any/all of the following data: independent review of imaging independent review of labs (comment specifically on pertinent positives and negatives) review of specific prior hospitalizations, PCP/specialist notes, etc. discuss meds given and prescribed document any discussion with consultants (including hospitalists) any clinical decision tools you used and why (PECARN, NEXUS, etc.) did you consider admitting the patient? document social determinants of health affecting patient's care (homelessness, inability to follow up in a timely fashion, etc) document any pre-existing conditions increasing risk on current visit (e.g. diabetes and HTN increasing danger of high-risk chest pain/ACS) describes what meds you gave (especially parenteral) and why any other interventions?:1}       FINAL CLINICAL IMPRESSION(S) / ED DIAGNOSES   Final diagnoses:  None     Rx / DC Orders   ED Discharge Orders     None        Note:  This document was prepared using Dragon voice recognition software and may include unintentional dictation errors.

## 2021-06-19 NOTE — ED Provider Notes (Signed)
? ?[facility] ?Provider Note ? ? ? Event Date/Time  ? First MD Initiated Contact with Patient 06/19/21 2312   ?  (approximate) ? ? ?History  ? ?Back Pain ? ? ?HPI ? ?Jared Arnold is a 51 y.o. male who presents to the ED from work with a chief complaint of back injury.  Patient was at work when some items in a milk crate fell off the pallet and landed on his back when he was bending over.  Patient estimates weight of objects approximately 5 to 6 pounds.  Complains of left thoracic back pain.  Denies fever, cough, chest pain, shortness of breath, abdominal pain, nausea, vomiting or hematuria. ?  ? ? ?Past Medical History  ?History reviewed. No pertinent past medical history. ? ? ?Active Problem List  ?There are no problems to display for this patient. ? ? ? ?Past Surgical History  ?History reviewed. No pertinent surgical history. ? ? ?Home Medications  ? ?Prior to Admission medications   ?Medication Sig Start Date End Date Taking? Authorizing Provider  ?cyclobenzaprine (FLEXERIL) 5 MG tablet 1 tablet every 8 hours as needed for muscle spasms 06/20/21  Yes Irean Hong, MD  ?naproxen (NAPROSYN) 500 MG tablet Take 1 tablet (500 mg total) by mouth 2 (two) times daily with a meal. 06/20/21  Yes Irean Hong, MD  ?traMADol (ULTRAM) 50 MG tablet Take 1 tablet (50 mg total) by mouth every 6 (six) hours as needed. 09/14/19   Tommi Rumps, PA-C  ? ? ? ?Allergies  ?Patient has no known allergies. ? ? ?Family History  ?History reviewed. No pertinent family history. ? ? ?Physical Exam  ?Triage Vital Signs: ?ED Triage Vitals  ?Enc Vitals Group  ?   BP 06/19/21 2308 (!) 162/92  ?   Pulse Rate 06/19/21 2308 62  ?   Resp 06/19/21 2308 16  ?   Temp 06/19/21 2308 98.9 ?F (37.2 ?C)  ?   Temp Source 06/19/21 2308 Oral  ?   SpO2 06/19/21 2308 99 %  ?   Weight 06/19/21 2309 175 lb (79.4 kg)  ?   Height 06/19/21 2309 6\' 1"  (1.854 m)  ?   Head Circumference --   ?   Peak Flow --   ?   Pain Score 06/19/21 2309 4  ?    Pain Loc --   ?   Pain Edu? --   ?   Excl. in [place]? --   ? ? ?Updated Vital Signs: ?BP (!) 162/92   Pulse 62   Temp 98.9 ?F (37.2 ?C) (Oral)   Resp 16   Ht 6\' 1"  (1.854 m)   Wt 79.4 kg   SpO2 99%   BMI 23.09 kg/m?  ? ? ?General: Awake, no distress.  ?CV:  RRR good peripheral perfusion.  ?Resp:  Normal effort.  CTA B. ?Abd:  Nontender.  No distention.  ?Other:  No spinal tenderness to palpation.  No obvious injury, abrasions or deformities.  Left thoracic back/posterior ribs tender to palpation.  No splinting.  No crepitus. ? ? ?ED Results / Procedures / Treatments  ?Labs ?(all labs ordered are listed, but only abnormal results are displayed) ?Labs Reviewed - No data to display ? ? ?EKG ? ?None ? ? ?RADIOLOGY ?I have independently visualized and reviewed patient's x-rays as well as noted the radiology interpretation: ? ?Left rib series: No rib fracture, no pneumothorax ? ?Thoracic spine x-rays: No fracture ? ?Official radiology report(s): ?DG  Ribs Unilateral W/Chest Left ? ?Result Date: 06/19/2021 ?CLINICAL DATA:  Recent blunt trauma to the left chest with pain, initial encounter EXAM: LEFT RIBS AND CHEST - 3+ VIEW COMPARISON:  04/05/2013 FINDINGS: Cardiac shadow is within normal limits. The lungs are well aerated bilaterally. No focal infiltrate or sizable effusion is noted. No pneumothorax is seen. Left-sided rib cage shows no acute fracture. No soft tissue abnormality is noted. IMPRESSION: No acute rib fracture is noted. Electronically Signed   By: Alcide Clever M.D.   On: 06/19/2021 23:55  ? ?DG Thoracic Spine 2 View ? ?Result Date: 06/19/2021 ?CLINICAL DATA:  Blunt trauma to the upper chest with pain, initial encounter EXAM: THORACIC SPINE 2 VIEWS COMPARISON:  None. FINDINGS: Pedicles are within normal limits. No paraspinal mass is seen. Visualize ribcage is within normal limits. No compression deformity is noted. No significant osteophytic changes are seen. IMPRESSION: No acute abnormality noted.  Electronically Signed   By: Alcide Clever M.D.   On: 06/19/2021 23:56   ? ? ?PROCEDURES: ? ?Critical Care performed: No ? ?Procedures ? ? ?MEDICATIONS ORDERED IN ED: ?Medications  ?ketorolac (TORADOL) injection 30 mg (30 mg Intramuscular Given 06/20/21 0034)  ? ? ? ?IMPRESSION / MDM / ASSESSMENT AND PLAN / ED COURSE  ?I reviewed the triage vital signs and the nursing notes. ?             ?               ?51 year old male presenting with left thoracic back pain status post injury.  Will obtain plain film imaging studies of left ribs and thoracic spine.  Patient is driving; IM ketorolac ordered for pain.  Will reassess. ? ?Clinical Course as of 06/20/21 0140  ?Thu Jun 20, 2021  ?0106 Pain significantly improved.  Updated patient on negative x-ray results.  Will discharge home with prescription for NSAIDs, muscle relaxer and patient will follow up with orthopedics as needed.  Strict return precautions given.  Patient verbalizes understanding agrees with plan of care. [JS]  ?  ?Clinical Course User Index ?[JS] Irean Hong, MD  ? ? ? ?FINAL CLINICAL IMPRESSION(S) / ED DIAGNOSES  ? ?Final diagnoses:  ?Acute left-sided thoracic back pain  ? ? ? ?Rx / DC Orders  ? ?ED Discharge Orders   ? ?      Ordered  ?  naproxen (NAPROSYN) 500 MG tablet  2 times daily with meals       ? 06/20/21 0108  ?  cyclobenzaprine (FLEXERIL) 5 MG tablet       ? 06/20/21 0108  ? ?  ?  ? ?  ? ? ? ?Note:  This document was prepared using Dragon voice recognition software and may include unintentional dictation errors. ?  ?Irean Hong, MD ?06/20/21 0140 ? ?

## 2021-06-19 NOTE — ED Triage Notes (Signed)
Pt presents to ER from work.  Pt states he was at work when some items in a milk crate fell off the pallet and landed on his back when pt was bent over.  Pt states he is having lower back pain at this time.  Denies LOC.  Pt A&O x4 at this time in NAD.   ?

## 2021-06-20 MED ORDER — NAPROXEN 500 MG PO TABS: 500.0000 mg | ORAL_TABLET | Freq: Two times a day (BID) | ORAL | 0 refills | Status: AC

## 2021-06-20 MED ORDER — CYCLOBENZAPRINE HCL 5 MG PO TABS: ORAL_TABLET | ORAL | 0 refills | Status: AC

## 2021-06-20 NOTE — Discharge Instructions (Signed)
1.  You may take medicines as needed for pain and muscle spasms (Naprosyn/Flexeril). 2.  Apply moist heat to affected area several times daily. 3.  Return to the ER for worsening symptoms, persistent vomiting, difficulty breathing or other concerns. 

## 2023-01-07 IMAGING — CR DG THORACIC SPINE 2V
3 series · 3 of 3 positions shown · non-contrast
Comparison: None.

CLINICAL DATA: Blunt trauma to the upper chest with pain, initial
encounter

EXAM:
THORACIC SPINE 2 VIEWS

[t-spine ap]
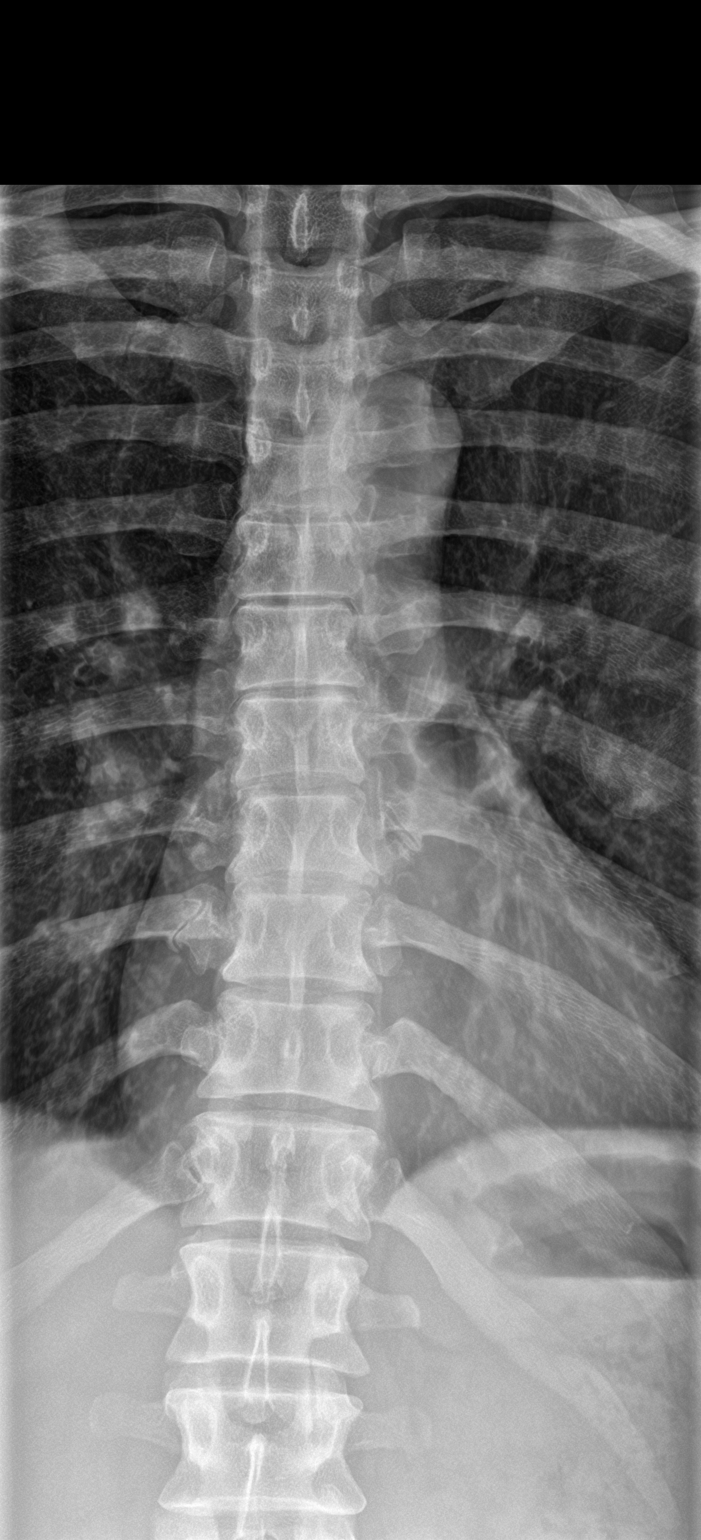

[t-spine lat]
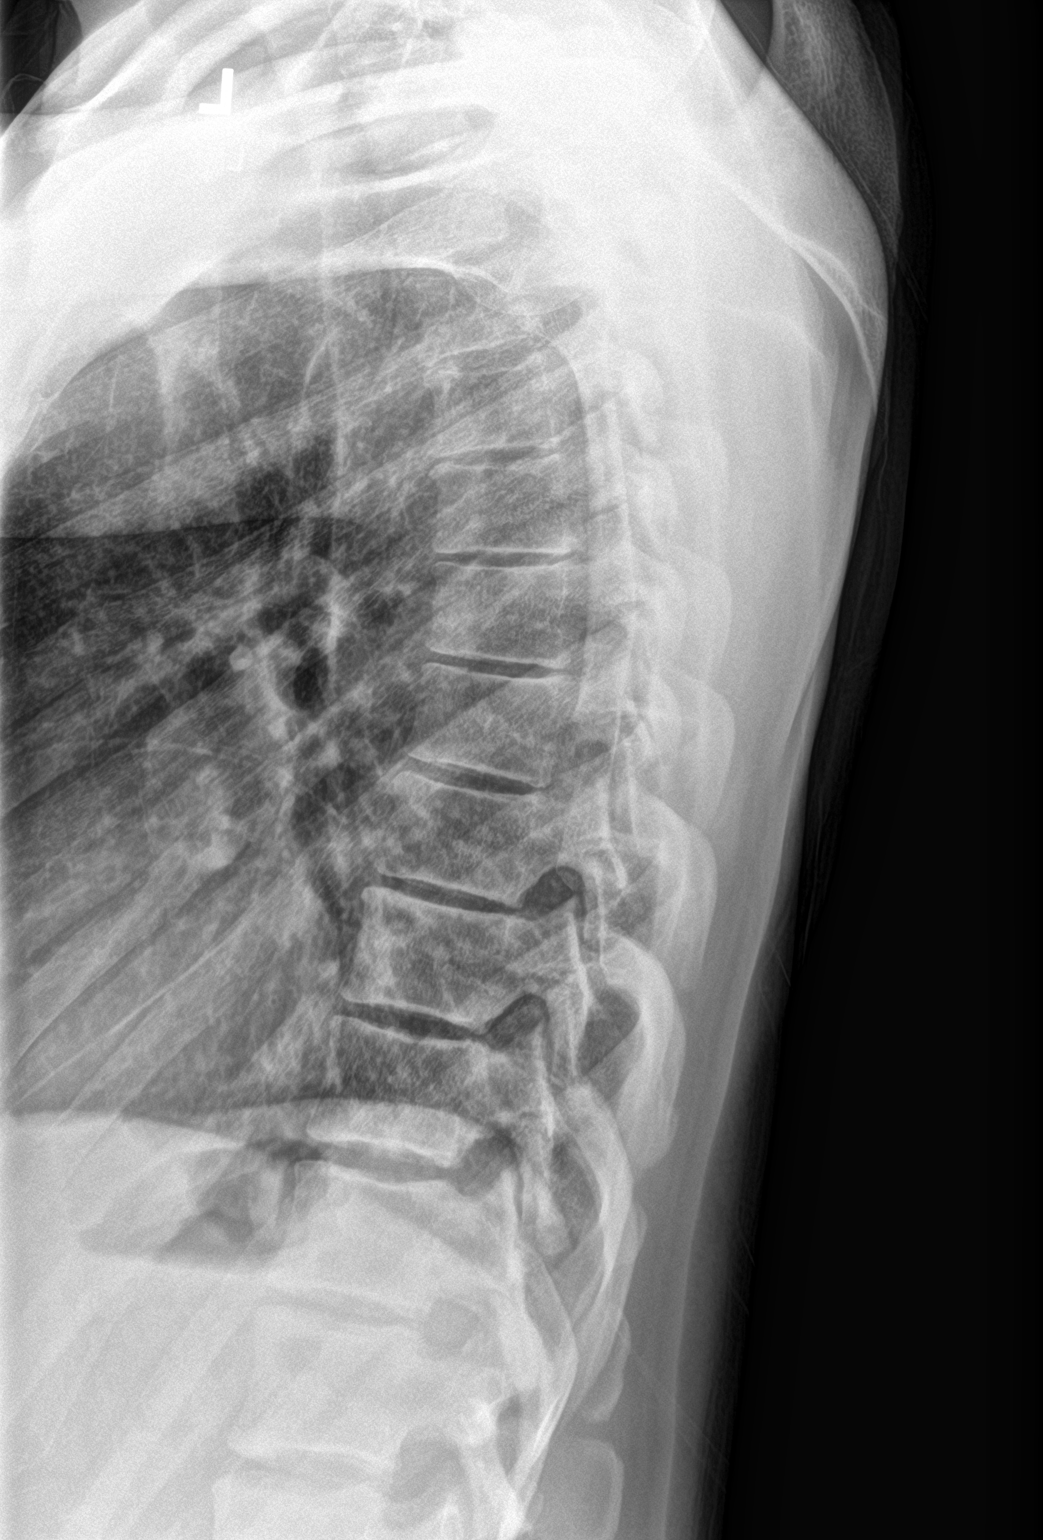

[t-spine swimmers]
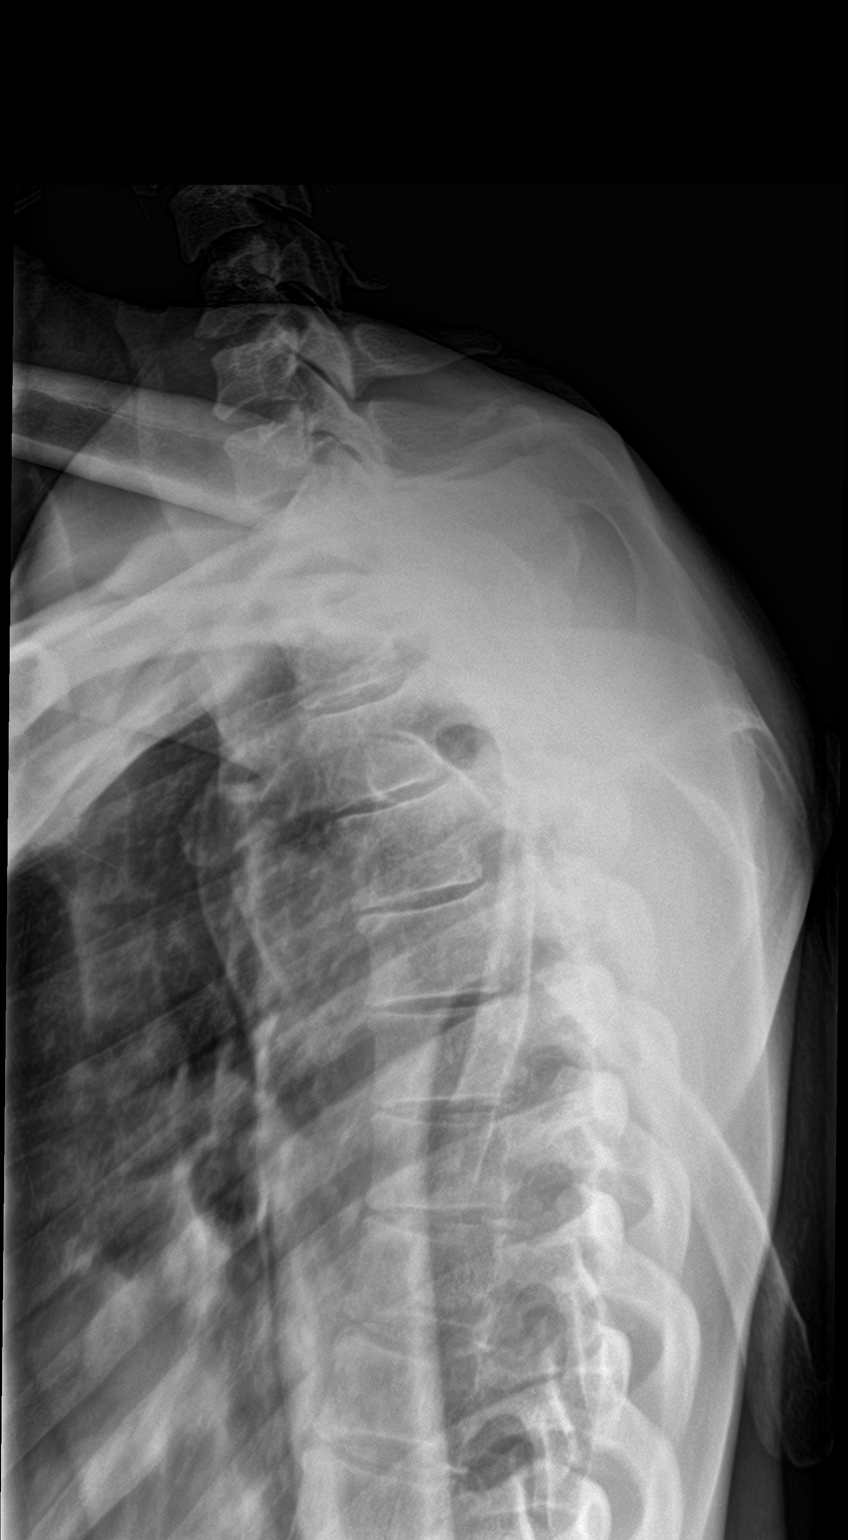

[3 of 3 positions shown; findings below may reference images not displayed]

FINDINGS: Pedicles are within normal limits. No paraspinal mass is seen.
Visualize ribcage is within normal limits. No compression deformity
is noted. No significant osteophytic changes are seen.
IMPRESSION: No acute abnormality noted.
